# Patient Record
Sex: Female | Born: 1955 | Race: White | Hispanic: No | Marital: Married | Smoking: Never smoker
Health system: Southern US, Community
[De-identification: ages and names within clinical notes are randomized; demographics above are authoritative.]

## PROBLEM LIST (undated history)

## (undated) DIAGNOSIS — E7800 Pure hypercholesterolemia, unspecified: Secondary | ICD-10-CM

## (undated) DIAGNOSIS — I10 Essential (primary) hypertension: Secondary | ICD-10-CM

## (undated) HISTORY — PX: ABDOMINAL SURGERY: SHX537

## (undated) HISTORY — PX: TONSILLECTOMY: SUR1361

## (undated) HISTORY — PX: BUNIONECTOMY: SHX129

---

## 1998-06-04 ENCOUNTER — Other Ambulatory Visit: Admission: RE | Admit: 1998-06-04 | Discharge: 1998-06-04 | Payer: Self-pay | Admitting: Obstetrics and Gynecology

## 1999-02-05 ENCOUNTER — Other Ambulatory Visit: Admission: RE | Admit: 1999-02-05 | Discharge: 1999-02-05 | Payer: Self-pay | Admitting: Obstetrics and Gynecology

## 1999-06-10 ENCOUNTER — Encounter: Payer: Self-pay | Admitting: Family Medicine

## 1999-06-10 ENCOUNTER — Encounter: Admission: RE | Admit: 1999-06-10 | Discharge: 1999-06-10 | Payer: Self-pay | Admitting: Family Medicine

## 1999-06-30 ENCOUNTER — Other Ambulatory Visit: Admission: RE | Admit: 1999-06-30 | Discharge: 1999-06-30 | Payer: Self-pay | Admitting: Obstetrics and Gynecology

## 2010-09-10 ENCOUNTER — Encounter: Payer: Self-pay | Admitting: Thoracic Surgery

## 2012-11-05 ENCOUNTER — Emergency Department (HOSPITAL_BASED_OUTPATIENT_CLINIC_OR_DEPARTMENT_OTHER)
Admission: EM | Admit: 2012-11-05 | Discharge: 2012-11-05 | Disposition: A | Discharge status: Home / Self Care | Payer: BC Managed Care – PPO | Attending: Emergency Medicine | Admitting: Emergency Medicine

## 2012-11-05 ENCOUNTER — Encounter (HOSPITAL_BASED_OUTPATIENT_CLINIC_OR_DEPARTMENT_OTHER): Payer: Self-pay | Admitting: Emergency Medicine

## 2012-11-05 DIAGNOSIS — I10 Essential (primary) hypertension: Secondary | ICD-10-CM | POA: Insufficient documentation

## 2012-11-05 DIAGNOSIS — J3489 Other specified disorders of nose and nasal sinuses: Secondary | ICD-10-CM | POA: Insufficient documentation

## 2012-11-05 DIAGNOSIS — E7800 Pure hypercholesterolemia, unspecified: Secondary | ICD-10-CM | POA: Insufficient documentation

## 2012-11-05 DIAGNOSIS — J019 Acute sinusitis, unspecified: Secondary | ICD-10-CM | POA: Insufficient documentation

## 2012-11-05 DIAGNOSIS — Z79899 Other long term (current) drug therapy: Secondary | ICD-10-CM | POA: Insufficient documentation

## 2012-11-05 HISTORY — DX: Pure hypercholesterolemia, unspecified: E78.00

## 2012-11-05 HISTORY — DX: Essential (primary) hypertension: I10

## 2012-11-05 MED ORDER — AZITHROMYCIN 250 MG PO TABS: 250.0000 mg | ORAL_TABLET | Freq: Every day | ORAL | Status: DC

## 2012-11-05 NOTE — ED Notes (Signed)
Pt having sinus/facial pressure x one week.  Worse today.  No known fever or congestion.  Pain over right eyebrow.

## 2012-11-05 NOTE — ED Provider Notes (Signed)
History     CSN: [id number]  Arrival date & time 11/05/12  1325   First MD Initiated Contact with Patient 11/05/12 1338      Chief Complaint  Patient presents with  . Facial Pain    (Consider location/radiation/quality/duration/timing/severity/associated sxs/prior treatment) Patient is a 57 y.o. female presenting with URI. The history is provided by the patient.  URI Presenting symptoms: congestion and facial pain   Severity:  Moderate Onset quality:  Gradual Duration:  2 weeks Timing:  Constant Progression:  Worsening Chronicity:  New Relieved by:  Nothing Worsened by:  Nothing tried Ineffective treatments:  Decongestant, hot fluids and OTC medications Associated symptoms: sinus pain     Past Medical History  Diagnosis Date  . Hypertension   . Hypercholesteremia     Past Surgical History  Procedure Laterality Date  . Tonsillectomy    . Abdominal surgery    . Bunionectomy      No family history on file.  History  Substance Use Topics  . Smoking status: Not on file  . Smokeless tobacco: Not on file  . Alcohol Use: Not on file    OB History   Grav Para Term Preterm Abortions TAB SAB Ect Mult Living                  Review of Systems  HENT: Positive for congestion.   All other systems reviewed and are negative.    Allergies  Sulfa antibiotics  Home Medications   Current Outpatient Rx  Name  Route  Sig  Dispense  Refill  . atorvastatin (LIPITOR) 10 MG tablet   Oral   Take 10 mg by mouth daily.         Marland Kitchen UNKNOWN TO PATIENT      htn med           BP 131/71  Pulse 80  Temp(Src) 97.8 F (36.6 C) (Oral)  Resp 16  Ht 5\' 6"  (1.676 m)  Wt 215 lb (97.523 kg)  BMI 34.72 kg/m2  SpO2 98%  Physical Exam  Nursing note and vitals reviewed. Constitutional: She is oriented to person, place, and time. She appears well-developed and well-nourished. No distress.  HENT:  Head: Normocephalic and atraumatic.  Mouth/Throat: Oropharynx is clear  and moist.  Bilateral TM's are clear.  There is ttp over the maxillary and frontal sinuses.  Neck: Normal range of motion. Neck supple.  Cardiovascular: Normal rate and regular rhythm.  Exam reveals no gallop and no friction rub.   No murmur heard. Pulmonary/Chest: Effort normal and breath sounds normal. No respiratory distress. She has no wheezes.  Abdominal: Soft. Bowel sounds are normal. She exhibits no distension. There is no tenderness.  Musculoskeletal: Normal range of motion.  Neurological: She is alert and oriented to person, place, and time.  Skin: Skin is warm and dry. She is not diaphoretic.    ED Course  Procedures (including critical care time)  Labs Reviewed - No data to display No results found.   No diagnosis found.    MDM  Will treat with zmax for acute sinusitis.        Geoffery Lyons, MD 11/05/12 1350

## 2013-01-06 ENCOUNTER — Encounter (HOSPITAL_BASED_OUTPATIENT_CLINIC_OR_DEPARTMENT_OTHER): Payer: Self-pay | Admitting: *Deleted

## 2013-01-06 ENCOUNTER — Emergency Department (HOSPITAL_BASED_OUTPATIENT_CLINIC_OR_DEPARTMENT_OTHER)
Admission: EM | Admit: 2013-01-06 | Discharge: 2013-01-06 | Disposition: A | Discharge status: Home / Self Care | Payer: BC Managed Care – PPO | Attending: Emergency Medicine | Admitting: Emergency Medicine

## 2013-01-06 DIAGNOSIS — E7800 Pure hypercholesterolemia, unspecified: Secondary | ICD-10-CM | POA: Insufficient documentation

## 2013-01-06 DIAGNOSIS — R0981 Nasal congestion: Secondary | ICD-10-CM

## 2013-01-06 DIAGNOSIS — Z79899 Other long term (current) drug therapy: Secondary | ICD-10-CM | POA: Insufficient documentation

## 2013-01-06 DIAGNOSIS — I10 Essential (primary) hypertension: Secondary | ICD-10-CM | POA: Insufficient documentation

## 2013-01-06 DIAGNOSIS — J3489 Other specified disorders of nose and nasal sinuses: Secondary | ICD-10-CM | POA: Insufficient documentation

## 2013-01-06 MED ORDER — FEXOFENADINE HCL 60 MG PO TABS: 60.0000 mg | ORAL_TABLET | Freq: Two times a day (BID) | ORAL | Status: DC

## 2013-01-06 MED ORDER — BECLOMETHASONE DIPROP MONOHYD 42 MCG/SPRAY NA SUSP: 2.0000 | Freq: Two times a day (BID) | NASAL | Status: DC

## 2013-01-06 MED ORDER — AMOXICILLIN-POT CLAVULANATE 875-125 MG PO TABS: 1.0000 | ORAL_TABLET | Freq: Two times a day (BID) | ORAL | Status: DC

## 2013-01-06 NOTE — ED Provider Notes (Signed)
CSN: [id number]     Arrival date & time 01/06/13  1007 History     First MD Initiated Contact with Patient 01/06/13 1020     Chief Complaint  Patient presents with  . Recurrent Sinusitis   (Consider location/radiation/quality/duration/timing/severity/associated sxs/prior Treatment) The history is provided by the patient.  Lisa Olsen is a 57 y.o. female history sinusitis here with sinus congestion. Sent congestion for the last month. Initial course of Z-Pak in June. It is a pressure in her sinuses as well as one episode of greenish discharge today. Tried some nasal congestion yesterday with minimal relief. Does not know if she has any history of seasonal allergies and she is not on oral antihistamines.    Past Medical History  Diagnosis Date  . Hypertension   . Hypercholesteremia    Past Surgical History  Procedure Laterality Date  . Tonsillectomy    . Abdominal surgery    . Bunionectomy     History reviewed. No pertinent family history. History  Substance Use Topics  . Smoking status: Never Smoker   . Smokeless tobacco: Not on file  . Alcohol Use: No   OB History   Grav Para Term Preterm Abortions TAB SAB Ect Mult Living                 Review of Systems  HENT: Positive for congestion and rhinorrhea.   All other systems reviewed and are negative.    Allergies  Sulfa antibiotics  Home Medications   Current Outpatient Rx  Name  Route  Sig  Dispense  Refill  . atorvastatin (LIPITOR) 10 MG tablet   Oral   Take 10 mg by mouth daily.         Marland Kitchen azithromycin (ZITHROMAX) 250 MG tablet   Oral   Take 1 tablet (250 mg total) by mouth daily. Take first 2 tablets together, then 1 every day until finished.   6 tablet   0   . UNKNOWN TO PATIENT      htn med          BP 145/77  Pulse 91  Temp(Src) 98.4 F (36.9 C)  Resp 18  SpO2 98% Physical Exam  Nursing note and vitals reviewed. Constitutional: She is oriented to person, place, and time. She  appears well-developed and well-nourished.  HENT:  Head: Normocephalic.  Mouth/Throat: Oropharynx is clear and moist.  Mild bilateral sinus tenderness. No purulent drainage from nose.   Eyes: Conjunctivae are normal. Pupils are equal, round, and reactive to light.  Neck: Normal range of motion. Neck supple.  Cardiovascular: Normal rate, regular rhythm and normal heart sounds.   Pulmonary/Chest: Effort normal and breath sounds normal. No respiratory distress. She has no wheezes. She has no rales.  Abdominal: Soft. Bowel sounds are normal. She exhibits no distension. There is no tenderness. There is no rebound.  Musculoskeletal: Normal range of motion.  Neurological: She is alert and oriented to person, place, and time.  Skin: Skin is warm.  Psychiatric: She has a normal mood and affect. Her behavior is normal. Judgment and thought content normal.    ED Course   Procedures (including critical care time)  Labs Reviewed - No data to display No results found. No diagnosis found.  MDM  [name] is a 57 y.o. female here with sinus congestion. Likely from allergies. Will prescribe allegra and nasal steroids. Not febrile but given hx of recurrent infections, I told her to take augmentin in 2 days if  symptoms not improving.    Richardean Canal, MD 01/06/13 1058

## 2013-01-06 NOTE — ED Notes (Signed)
PT reports sinus infection in June.  Finished antibiotics.  Pt reports being 'stuffy' since June.  Reports that she has pressure behind her eye.

## 2015-07-14 ENCOUNTER — Emergency Department (HOSPITAL_BASED_OUTPATIENT_CLINIC_OR_DEPARTMENT_OTHER)
Admission: EM | Admit: 2015-07-14 | Discharge: 2015-07-14 | Disposition: A | Discharge status: Home / Self Care | Payer: 59 | Attending: Emergency Medicine | Admitting: Emergency Medicine

## 2015-07-14 ENCOUNTER — Encounter (HOSPITAL_BASED_OUTPATIENT_CLINIC_OR_DEPARTMENT_OTHER): Payer: Self-pay | Admitting: *Deleted

## 2015-07-14 DIAGNOSIS — Z79899 Other long term (current) drug therapy: Secondary | ICD-10-CM | POA: Insufficient documentation

## 2015-07-14 DIAGNOSIS — I10 Essential (primary) hypertension: Secondary | ICD-10-CM | POA: Insufficient documentation

## 2015-07-14 DIAGNOSIS — J0100 Acute maxillary sinusitis, unspecified: Secondary | ICD-10-CM | POA: Insufficient documentation

## 2015-07-14 DIAGNOSIS — R51 Headache: Secondary | ICD-10-CM | POA: Diagnosis present

## 2015-07-14 DIAGNOSIS — E7800 Pure hypercholesterolemia, unspecified: Secondary | ICD-10-CM | POA: Insufficient documentation

## 2015-07-14 DIAGNOSIS — Z7951 Long term (current) use of inhaled steroids: Secondary | ICD-10-CM | POA: Insufficient documentation

## 2015-07-14 MED ORDER — AMOXICILLIN-POT CLAVULANATE 875-125 MG PO TABS: 1.0000 | ORAL_TABLET | Freq: Two times a day (BID) | ORAL | Status: AC

## 2015-07-14 MED ORDER — OXYMETAZOLINE HCL 0.05 % NA SOLN: 1.0000 | Freq: Two times a day (BID) | NASAL | Status: AC

## 2015-07-14 MED ORDER — NAPROXEN 500 MG PO TABS: 500.0000 mg | ORAL_TABLET | Freq: Two times a day (BID) | ORAL | Status: AC

## 2015-07-14 MED ORDER — PSEUDOEPHEDRINE HCL 30 MG PO TABS: 30.0000 mg | ORAL_TABLET | ORAL | Status: AC | PRN

## 2015-07-14 NOTE — Discharge Instructions (Signed)
Please read and follow all provided instructions.  Your diagnoses today include:  1. Acute maxillary sinusitis, recurrence not specified    Tests performed today include:  Vital signs. See below for your results today.   Medications prescribed:   Pseudoephedrine - decongestant medication to help with nasal congestion   Oxymetazoline - nasal spray for congestion. Do not use for more than 3 days because this medicine can cause rebound congestion.    Naproxen - anti-inflammatory pain medication  Do not exceed  naproxen every 12 hours, take with food  You have been prescribed an anti-inflammatory medication or NSAID. Take with food. Take smallest effective dose for the shortest duration needed for your pain. Stop taking if you experience stomach pain or vomiting.    Augmentin - antibiotic  You have been prescribed an antibiotic medicine: take the entire course of medicine even if you are feeling better. Stopping early can cause the antibiotic not to work.  Take any prescribed medications only as directed.  Home care instructions:  Follow any educational materials contained in this packet.  BE VERY CAREFUL not to take multiple medicines containing Tylenol (also called acetaminophen). Doing so can lead to an overdose which can damage your liver and cause liver failure and possibly death.   Follow-up instructions: Please follow-up with your primary care provider in the next 5 days for further evaluation of your symptoms.   Return instructions:   Please return to the Emergency Department if you experience worsening symptoms.   Please return if you have any other emergent concerns.  Additional Information:  Your vital signs today were: BP 158/83 mmHg   Pulse 98   Temp(Src) 99.2 F (37.3 C) (Oral)   Resp 18   Ht  (1.702 m)   Wt 99.791 kg   BMI 34.45 kg/m2   SpO2 97% If your blood pressure (BP) was elevated above 135/85 this visit, please have this repeated by your  doctor within one month. --------------

## 2015-07-14 NOTE — ED Notes (Signed)
Right side facial pain x 1 day- pt states similar sx with sinus infection in the past- denies fever

## 2015-07-14 NOTE — ED Provider Notes (Signed)
CSN: [id number]     Arrival date & time 07/14/15  1959 History   First MD Initiated Contact with Patient 07/14/15 2147     Chief Complaint  Patient presents with  . Facial Pain     (Consider location/radiation/quality/duration/timing/severity/associated sxs/prior Treatment) HPI Comments: Patient presents with complaint of sinusitis starting one day ago. Patient complains of right maxillary tenderness and pain with associated headache. She states that pain radiates to her teeth. She has had similar symptoms with sinus infections in the past. She's taken one Sudafed without relief. No treatment prior to arrival. Onset of symptoms acute. Course constant. Nothing makes symptoms better or worse. No chest pain, cough, shortness of breath. No nausea, vomiting or abdominal pain.  The history is provided by the patient.    Past Medical History  Diagnosis Date  . Hypertension   . Hypercholesteremia    Past Surgical History  Procedure Laterality Date  . Tonsillectomy    . Abdominal surgery    . Bunionectomy     No family history on file. Social History  Substance Use Topics  . Smoking status: Never Smoker   . Smokeless tobacco: None  . Alcohol Use: No   OB History    No data available     Review of Systems  Constitutional: Negative for fever, chills and fatigue.  HENT: Positive for congestion and sinus pressure. Negative for ear pain, rhinorrhea and sore throat.   Eyes: Negative for redness.  Respiratory: Negative for cough and wheezing.   Gastrointestinal: Negative for nausea, vomiting, abdominal pain and diarrhea.  Genitourinary: Negative for dysuria.  Musculoskeletal: Negative for myalgias and neck stiffness.  Skin: Negative for rash.  Neurological: Positive for headaches.  Hematological: Negative for adenopathy.      Allergies  Sulfa antibiotics  Home Medications   Prior to Admission medications   Medication Sig Start Date End Date Taking? Authorizing Provider   amoxicillin-clavulanate (AUGMENTIN) 875-125 MG per tablet Take 1 tablet by mouth 2 (two) times daily. One po bid x 7 days 01/06/13   Richardean Canal, MD  atorvastatin (LIPITOR) 10 MG tablet Take 10 mg by mouth daily.    Historical Provider, MD  azithromycin (ZITHROMAX) 250 MG tablet Take 1 tablet (250 mg total) by mouth daily. Take first 2 tablets together, then 1 every day until finished. 11/05/12   Geoffery Lyons, MD  beclomethasone (BECONASE-AQ) 42 MCG/SPRAY nasal spray Place 2 sprays into the nose 2 (two) times daily. Dose is for each nostril. 01/06/13   Richardean Canal, MD  fexofenadine (ALLEGRA) 60 MG tablet Take 1 tablet (60 mg total) by mouth 2 (two) times daily. 01/06/13   Richardean Canal, MD  UNKNOWN TO PATIENT htn med    Historical Provider, MD   BP 158/83 mmHg  Pulse 98  Temp(Src) 99.2 F (37.3 C) (Oral)  Resp 18  Ht  (1.702 m)  Wt 99.791 kg  BMI 34.45 kg/m2  SpO2 97% Physical Exam  Constitutional: She appears well-developed and well-nourished.  HENT:  Head: Normocephalic and atraumatic.  Right Ear: Tympanic membrane, external ear and ear canal normal.  Left Ear: Tympanic membrane, external ear and ear canal normal.  Nose: Mucosal edema and rhinorrhea present. Right sinus exhibits maxillary sinus tenderness.  Mouth/Throat: Uvula is midline, oropharynx is clear and moist and mucous membranes are normal. Mucous membranes are not dry. No oral lesions. No trismus in the jaw. No uvula swelling. No oropharyngeal exudate, posterior oropharyngeal edema, posterior oropharyngeal erythema or tonsillar  abscesses.  Eyes: Conjunctivae are normal. Right eye exhibits no discharge. Left eye exhibits no discharge.  Neck: Normal range of motion. Neck supple.  Cardiovascular: Normal rate, regular rhythm and normal heart sounds.   Pulmonary/Chest: Effort normal and breath sounds normal. No respiratory distress. She has no wheezes. She has no rales.  Abdominal: Soft. There is no tenderness.  Lymphadenopathy:     She has no cervical adenopathy.  Neurological: She is alert.  Skin: Skin is warm and dry.  Psychiatric: She has a normal mood and affect.  Nursing note and vitals reviewed.   ED Course  Procedures (including critical care time) Labs Review Labs Reviewed - No data to display  Imaging Review No results found. I have personally reviewed and evaluated these images and lab results as part of my medical decision-making.   EKG Interpretation None       10:13 PM Patient seen and examined.  Vital signs reviewed and are as follows: BP 158/83 mmHg  Pulse 98  Temp(Src) 99.2 F (37.3 C) (Oral)  Resp 18  Ht  (1.702 m)  Wt 99.791 kg  BMI 34.45 kg/m2  SpO2 97%  Conservative treatment with decongestant, Afrin, NSAIDs.  Per patient request -- Augmentin given. Patient agrees to only fill this antibiotic if she has symptoms lasting longer than 7 days or develops a fever greater than 101F. We discussed that her sx are most likely viral. Patient verbalizes understanding and agrees with plan.    MDM   Final diagnoses:  Acute maxillary sinusitis, recurrence not specified   Patient with signs and symptoms consistent with acute sinusitis. Patient is afebrile, well-appearing. States BP is typically very well controlled. No h/o ACS.    Renne Crigler, PA-C 07/14/15 2224  Jerelyn Scott, MD 07/14/15 2227

## 2016-06-24 DIAGNOSIS — Z23 Encounter for immunization: Secondary | ICD-10-CM | POA: Diagnosis not present

## 2016-11-26 DIAGNOSIS — Z882 Allergy status to sulfonamides status: Secondary | ICD-10-CM | POA: Diagnosis not present

## 2016-11-26 DIAGNOSIS — N1330 Unspecified hydronephrosis: Secondary | ICD-10-CM | POA: Diagnosis not present

## 2016-11-26 DIAGNOSIS — R350 Frequency of micturition: Secondary | ICD-10-CM | POA: Diagnosis not present

## 2016-11-26 DIAGNOSIS — N134 Hydroureter: Secondary | ICD-10-CM | POA: Diagnosis not present

## 2016-11-26 DIAGNOSIS — N201 Calculus of ureter: Secondary | ICD-10-CM | POA: Diagnosis not present

## 2016-11-27 DIAGNOSIS — R300 Dysuria: Secondary | ICD-10-CM | POA: Diagnosis not present

## 2018-06-28 DIAGNOSIS — R0981 Nasal congestion: Secondary | ICD-10-CM | POA: Diagnosis not present

## 2018-06-28 DIAGNOSIS — R05 Cough: Secondary | ICD-10-CM | POA: Diagnosis not present

## 2020-04-18 ENCOUNTER — Telehealth (HOSPITAL_BASED_OUTPATIENT_CLINIC_OR_DEPARTMENT_OTHER): Payer: Self-pay | Admitting: Emergency Medicine

## 2020-04-18 ENCOUNTER — Other Ambulatory Visit: Payer: Self-pay

## 2020-04-18 ENCOUNTER — Emergency Department (HOSPITAL_BASED_OUTPATIENT_CLINIC_OR_DEPARTMENT_OTHER): Payer: Self-pay

## 2020-04-18 ENCOUNTER — Emergency Department (HOSPITAL_BASED_OUTPATIENT_CLINIC_OR_DEPARTMENT_OTHER)
Admission: EM | Admit: 2020-04-18 | Discharge: 2020-04-18 | Disposition: A | Discharge status: Home / Self Care | Payer: Self-pay | Attending: Emergency Medicine | Admitting: Emergency Medicine

## 2020-04-18 ENCOUNTER — Encounter (HOSPITAL_BASED_OUTPATIENT_CLINIC_OR_DEPARTMENT_OTHER): Payer: Self-pay | Admitting: Emergency Medicine

## 2020-04-18 DIAGNOSIS — Z20822 Contact with and (suspected) exposure to covid-19: Secondary | ICD-10-CM | POA: Insufficient documentation

## 2020-04-18 DIAGNOSIS — R5383 Other fatigue: Secondary | ICD-10-CM | POA: Insufficient documentation

## 2020-04-18 DIAGNOSIS — R202 Paresthesia of skin: Secondary | ICD-10-CM | POA: Insufficient documentation

## 2020-04-18 DIAGNOSIS — R509 Fever, unspecified: Secondary | ICD-10-CM | POA: Insufficient documentation

## 2020-04-18 DIAGNOSIS — R0981 Nasal congestion: Secondary | ICD-10-CM | POA: Insufficient documentation

## 2020-04-18 DIAGNOSIS — I10 Essential (primary) hypertension: Secondary | ICD-10-CM | POA: Insufficient documentation

## 2020-04-18 DIAGNOSIS — K120 Recurrent oral aphthae: Secondary | ICD-10-CM | POA: Insufficient documentation

## 2020-04-18 DIAGNOSIS — R0602 Shortness of breath: Secondary | ICD-10-CM | POA: Insufficient documentation

## 2020-04-18 DIAGNOSIS — Z79899 Other long term (current) drug therapy: Secondary | ICD-10-CM | POA: Insufficient documentation

## 2020-04-18 LAB — CBC WITH DIFFERENTIAL/PLATELET
Abs Immature Granulocytes: 0.05 10*3/uL (ref 0.00–0.07)
Basophils Absolute: 0.1 10*3/uL (ref 0.0–0.1)
Basophils Relative: 1 %
Eosinophils Absolute: 0.4 10*3/uL (ref 0.0–0.5)
Eosinophils Relative: 4 %
HCT: 41 % (ref 36.0–46.0)
Hemoglobin: 13.8 g/dL (ref 12.0–15.0)
Immature Granulocytes: 1 %
Lymphocytes Relative: 17 %
Lymphs Abs: 1.6 10*3/uL (ref 0.7–4.0)
MCH: 27.6 pg (ref 26.0–34.0)
MCHC: 33.7 g/dL (ref 30.0–36.0)
MCV: 82 fL (ref 80.0–100.0)
Monocytes Absolute: 0.5 10*3/uL (ref 0.1–1.0)
Monocytes Relative: 6 %
Neutro Abs: 6.5 10*3/uL (ref 1.7–7.7)
Neutrophils Relative %: 71 %
Platelets: 217 10*3/uL (ref 150–400)
RBC: 5 MIL/uL (ref 3.87–5.11)
RDW: 15 % (ref 11.5–15.5)
WBC: 9.1 10*3/uL (ref 4.0–10.5)
nRBC: 0 % (ref 0.0–0.2)

## 2020-04-18 LAB — RESP PANEL BY RT-PCR (FLU A&B, COVID) ARPGX2
Influenza A by PCR: NEGATIVE
Influenza B by PCR: NEGATIVE
SARS Coronavirus 2 by RT PCR: NEGATIVE

## 2020-04-18 LAB — COMPREHENSIVE METABOLIC PANEL
ALT: 71 U/L — ABNORMAL HIGH (ref 0–44)
AST: 71 U/L — ABNORMAL HIGH (ref 15–41)
Albumin: 4.1 g/dL (ref 3.5–5.0)
Alkaline Phosphatase: 203 U/L — ABNORMAL HIGH (ref 38–126)
Anion gap: 13 (ref 5–15)
BUN: 13 mg/dL (ref 8–23)
CO2: 22 mmol/L (ref 22–32)
Calcium: 9.2 mg/dL (ref 8.9–10.3)
Chloride: 103 mmol/L (ref 98–111)
Creatinine, Ser: 0.65 mg/dL (ref 0.44–1.00)
GFR, Estimated: >60 mL/min (ref >60)
Glucose, Bld: 99 mg/dL (ref 70–99)
Potassium: 3.6 mmol/L (ref 3.5–5.1)
Sodium: 138 mmol/L (ref 135–145)
Total Bilirubin: 0.8 mg/dL (ref 0.3–1.2)
Total Protein: 7.5 g/dL (ref 6.5–8.1)

## 2020-04-18 LAB — TSH: TSH: 0.734 u[IU]/mL (ref 0.350–4.500)

## 2020-04-18 LAB — D-DIMER, QUANTITATIVE: D-Dimer, Quant: 0.79 ug/mL-FEU — ABNORMAL HIGH (ref 0.00–0.50)

## 2020-04-18 LAB — MAGNESIUM: Magnesium: 1.9 mg/dL (ref 1.7–2.4)

## 2020-04-18 MED ORDER — SODIUM CHLORIDE 0.9 % IV SOLN
Freq: Once | INTRAVENOUS | Status: AC
Start: 2020-04-18 — End: 2020-04-18

## 2020-04-18 MED ORDER — MAGIC MOUTHWASH W/LIDOCAINE: 5.0000 mL | Freq: Three times a day (TID) | ORAL | 0 refills | Status: AC | PRN

## 2020-04-18 MED ORDER — IOHEXOL 350 MG/ML SOLN
100.0000 mL | Freq: Once | INTRAVENOUS | Status: AC | PRN
  Administered 2020-04-18: 100 mL via INTRAVENOUS

## 2020-04-18 MED ORDER — ACETAMINOPHEN 325 MG PO TABS
650.0000 mg | ORAL_TABLET | Freq: Once | ORAL | Status: AC
  Administered 2020-04-18: 650 mg via ORAL
  Filled 2020-04-18: qty 2

## 2020-04-18 NOTE — Discharge Instructions (Addendum)
Please follow-up with your primary care doctor. Please establish care with one. I also provided you with information for a neurologist. Please follow-up with them for your left leg and left arm paresthesias.  As we discussed if you develop any weakness, facial drooping, slurred speech, confusion, or any other new or concerning symptoms please come to the ER for reevaluation.  I provided you with a mouthwash to use this to rinse and spit up to 3 times daily as needed for your mouth pain.  Please use Flonase also twice daily

## 2020-04-18 NOTE — ED Notes (Signed)
Pt standing outside the lab requesting a wheelchair. Wheelchair brought to pt and pt taken outside to vehicle.

## 2020-04-18 NOTE — ED Triage Notes (Signed)
Pt c/o ongoing nasal congestion x 2 weeks. She was treated with abx without relief. Also reports sores in her mouth. She states that last night she noticed the L side of her body was numb. She states it feels like it is asleep.

## 2020-04-18 NOTE — ED Notes (Signed)
Ambulated to br with assist and then to CT
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
## 2020-04-18 NOTE — ED Notes (Signed)
Radiology attempted to get pt for imaging. RN asked to come back after IV was in place

## 2020-04-18 NOTE — ED Notes (Signed)
States that she had seen a dr for her cold and was on antibiotics for 7 days prior to coming and cannot breath thru her nose

## 2020-04-18 NOTE — ED Notes (Signed)
Noticed her left arm and hand were numb at 10 pm she got up  And then  Midnight her left leg was not working right  ,  Woke up ate breakfast and fixed her own  Breakfast  Eggs and grits  Nothing got better and she decided to come in ,  Denies incontinence,  Speech is clear  , no facial droop VAN is neg
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
## 2020-04-18 NOTE — ED Provider Notes (Signed)
[facility] EMERGENCY DEPARTMENT Provider Note   CSN: [id number] Arrival date & time: 04/18/20  1344     History Chief Complaint  Patient presents with  . Nasal Congestion  . Numbness    Lisa Olsen is a 64 y.o. female.  HPI  Patient is a 64 year old female with past medical history significant for HTN and hypercholesterolemia presented today with complaints of left-sided body paresthesias.  She states that 18 days ago she started having congestion and fevers and chills.  She was placed on Augmentin which with this for 7 days she states that this did not help significantly and she continues to have sinus congestion and pressure.  She states that he days ago she started having canker sores in her mouth and last night at around 10 PM she noticed that she had a gradual onset of left-sided paresthesias she describes as the entire left side of her body not including her face.   She states that she is also now having trouble walking she states that she has trouble knowing where her feet and hands are in space.  She endorsed some confusion last night when operating the microwave.  She denies any lightheadedness, dizziness, headache, chest pain. She does endorse some SOB but Seems to have some difficulty differentiating this from sinus congestion or difficulty breathing.  She is obese but denies any history of VTE, hemoptysis, heart palpitations.  Denies any unilateral leg swelling or edema.  Denies any weight changes.    Past Medical History:  Diagnosis Date  . Hypercholesteremia   . Hypertension     There are no problems to display for this patient.   Past Surgical History:  Procedure Laterality Date  . ABDOMINAL SURGERY    . BUNIONECTOMY    . TONSILLECTOMY       OB History   No obstetric history on file.     No family history on file.  Social History   Tobacco Use  . Smoking status: Never Smoker  . Smokeless tobacco: Never Used  Substance Use Topics   . Alcohol use: No  . Drug use: No    Home Medications Prior to Admission medications   Medication Sig Start Date End Date Taking? Authorizing Provider  amoxicillin-clavulanate (AUGMENTIN) 875-125 MG tablet Take 1 tablet by mouth every 12 (twelve) hours. 07/14/15   Carlisle Cater, PA-C  magic mouthwash w/lidocaine SOLN Take 5 mLs by mouth 3 (three) times daily as needed for mouth pain. 1 part diphenhydramine, 1 part maalox, 1 part 2% viscous lidocaine 04/18/20   Makeda Peeks, [place] S, PA  naproxen (NAPROSYN) 500 MG tablet Take 1 tablet (500 mg total) by mouth 2 (two) times daily. 07/14/15   Carlisle Cater, PA-C  oxymetazoline (AFRIN NASAL SPRAY) 0.05 % nasal spray Place 1 spray into both nostrils 2 (two) times daily. 07/14/15   Carlisle Cater, PA-C  pseudoephedrine (SUDAFED) 30 MG tablet Take 1 tablet (30 mg total) by mouth every 4 (four) hours as needed for congestion. 07/14/15   Carlisle Cater, PA-C  UNKNOWN TO PATIENT htn med    [provider]  atorvastatin (LIPITOR) 10 MG tablet Take 10 mg by mouth daily.  07/14/15  [provider]  beclomethasone (BECONASE-AQ) 42 MCG/SPRAY nasal spray Place 2 sprays into the nose 2 (two) times daily. Dose is for each nostril. 01/06/13 07/14/15  Drenda Freeze, MD  fexofenadine (ALLEGRA) 60 MG tablet Take 1 tablet (60 mg total) by mouth 2 (two) times daily. 01/06/13 07/14/15  Drenda Freeze, MD    Allergies    Sulfa antibiotics  Review of Systems   Review of Systems  Constitutional: Positive for chills, fatigue and fever.  HENT: Positive for congestion, sinus pressure and sinus pain.        Canker sores  Eyes: Negative for pain.  Respiratory: Positive for shortness of breath. Negative for cough.   Cardiovascular: Negative for chest pain and leg swelling.  Gastrointestinal: Negative for abdominal distention, abdominal pain, diarrhea, nausea and vomiting.  Genitourinary: Negative for dysuria.  Musculoskeletal: Negative for myalgias.   Skin: Negative for rash.  Neurological: Positive for numbness. Negative for dizziness and headaches.    Physical Exam Updated Vital Signs BP (!) 161/91   Pulse (!) 101   Temp 99.5 F (37.5 C) (Oral)   Resp 20   Ht 5' 7"  (1.702 m)   Wt 99 kg   SpO2 97%   BMI 34.18 kg/m   Physical Exam Vitals and nursing note reviewed.  Constitutional:      General: She is not in acute distress.    Appearance: She is obese.     Comments: Somewhat fatigued appearing 64 year old female who is obese.  In no significant distress.  HENT:     Head: Normocephalic and atraumatic.     Nose: Nose normal.     Mouth/Throat:     Mouth: Mucous membranes are dry.     Comments: Several small scattered 2 mm in diameter canker sores located in the posterior mouth primarily over the soft palate and at the base of the tongue Eyes:     General: No scleral icterus.    Extraocular Movements: Extraocular movements intact.     Pupils: Pupils are equal, round, and reactive to light.  Cardiovascular:     Rate and Rhythm: Regular rhythm. Tachycardia present.     Pulses: Normal pulses.     Heart sounds: Normal heart sounds.     Comments: Tachycardic heart rate of 120 Pulmonary:     Effort: Pulmonary effort is normal. No respiratory distress.     Breath sounds: Normal breath sounds. No wheezing.     Comments: Lungs are clear to auscultation all fields Respiratory rate is 22 unlabored speaking full sentences. Abdominal:     Palpations: Abdomen is soft.     Tenderness: There is no abdominal tenderness. There is no guarding or rebound.  Musculoskeletal:     Cervical back: Normal range of motion.     Right lower leg: No edema.     Left lower leg: No edema.  Skin:    General: Skin is warm and dry.     Capillary Refill: Capillary refill takes less than 2 seconds.  Neurological:     Mental Status: She is alert. Mental status is at baseline.     Comments: Alert and oriented to self, place, time and event.   Speech  is fluent, clear without dysarthria or dysphasia.   Strength 5/5 in upper/lower extremities  Sensation intact in upper/lower extremities  Seems to show good proprioception in bilateral upper and lower extremities with normal cerebellar function with heel-to-shin finger-nose and toe tapping  Gait seems hesitant but she is ambulatory around the entire room.  Negative Romberg. No pronator drift.  Normal finger-to-nose and feet tapping.  CN I not tested  CN II grossly intact visual fields bilaterally. Did not visualize posterior eye.   CN III, IV, VI PERRLA and EOMs intact bilaterally  CN V Intact sensation to sharp and  light touch to the face  CN VII facial movements symmetric  CN VIII not tested  CN IX, X no uvula deviation, symmetric rise of soft palate  CN XI 5/5 SCM and trapezius strength bilaterally  CN XII Midline tongue protrusion, symmetric L/R movements   Psychiatric:        Mood and Affect: Mood normal.        Behavior: Behavior normal.     ED Results / Procedures / Treatments   Labs (all labs ordered are listed, but only abnormal results are displayed) Labs Reviewed  COMPREHENSIVE METABOLIC PANEL - Abnormal; Notable for the following components:      Result Value   AST 71 (*)    ALT 71 (*)    Alkaline Phosphatase 203 (*)    All other components within normal limits  D-DIMER, QUANTITATIVE (NOT AT [facility]) - Abnormal; Notable for the following components:   D-Dimer, Quant 0.79 (*)    All other components within normal limits  RESP PANEL BY RT-PCR (FLU A&B, COVID) ARPGX2  CBC WITH DIFFERENTIAL/PLATELET  MAGNESIUM  TSH    EKG EKG Interpretation  Date/Time:  Thursday April 18 2020 14:30:46 EST Ventricular Rate:  105 PR Interval:    QRS Duration: 95 QT Interval:  343 QTC Calculation: 454 R Axis:   57 Text Interpretation: Sinus tachycardia Consider right atrial enlargement no ischemic changes Confirmed by Theotis Burrow [phone]) on 04/18/2020 4:52:34  PM   Radiology CT Head Wo Contrast  Result Date: 04/18/2020 CLINICAL DATA:  LEFT-sided numbness EXAM: CT HEAD WITHOUT CONTRAST TECHNIQUE: Contiguous axial images were obtained from the base of the skull through the vertex without intravenous contrast. COMPARISON:  None. FINDINGS: Brain: Hypodensity of the RIGHT periventricular white matter along the superior aspect of the caudate, favored chronic given fluid attenuation (series 2, image 15). No acute hemorrhage. Vascular: Vascular calcifications. Skull: Normal. Negative for fracture or focal lesion. Sinuses/Orbits: Mucosal thickening and frothy fluid throughout the frontal sinuses and ethmoid air cells. Air-fluid levels within the sphenoid sinuses. Other: None. IMPRESSION: 1. Hypodensity of the RIGHT periventricular white matter along the superior aspect of the caudate, favored chronic given fluid attenuation. MRI could be utilized to further evaluate for acute infarction if clinically suspected. 2. Paranasal sinus disease as above. Findings could reflect acute sinusitis in the appropriate clinical setting. Electronically Signed   By: Valentino Saxon MD   On: 04/18/2020 15:21   CT Angio Chest PE W/Cm &/Or Wo Cm  Result Date: 04/18/2020 CLINICAL DATA:  Low to intermediate probability of pulmonary embolism. Positive D-dimer. Nasal congestion. EXAM: CT ANGIOGRAPHY CHEST WITH CONTRAST TECHNIQUE: Multidetector CT imaging of the chest was performed using the standard protocol during bolus administration of intravenous contrast. Multiplanar CT image reconstructions and MIPs were obtained to evaluate the vascular anatomy. CONTRAST:  17m OMNIPAQUE IOHEXOL 350 MG/ML SOLN COMPARISON:  Chest radiography same day. Chest CT report 06/23/2013. FINDINGS: Cardiovascular: Heart size upper limits of normal. No pericardial effusion. Coronary artery calcification and aortic atherosclerotic calcification. Pulmonary arterial opacification is good. No pulmonary emboli.  Mediastinum/Nodes: No mass or lymphadenopathy. Lungs/Pleura: No pleural effusion. The lungs are clear. No infiltrate or collapse. Upper Abdomen: No significant upper abdominal finding. Early perfusion pattern of the spleen. Musculoskeletal: Ordinary mild thoracic degenerative changes. Review of the MIP images confirms the above findings. IMPRESSION: 1. No pulmonary emboli or other acute chest pathology. 2. Coronary artery calcification and aortic atherosclerotic calcification. Aortic Atherosclerosis (ICD10-I70.0). Electronically Signed   By: MJan FiremanD.  On: 04/18/2020 17:03   DG Chest Port 1 View  Result Date: 04/18/2020 CLINICAL DATA:  Nasal congestion EXAM: PORTABLE CHEST 1 VIEW COMPARISON:  None. FINDINGS: The cardiomediastinal silhouette is normal in contour. Atherosclerotic calcifications of the aorta. Mild elevation of the RIGHT hemidiaphragm. No pleural effusion. No pneumothorax. No acute pleuroparenchymal abnormality. Visualized abdomen is unremarkable. No acute osseous abnormality noted. IMPRESSION: No acute cardiopulmonary abnormality. Electronically Signed   By: Valentino Saxon MD   On: 04/18/2020 15:22    Procedures Procedures (including critical care time)  Medications Ordered in ED Medications  0.9 %  sodium chloride infusion (0 mLs Intravenous Stopped 04/18/20 1641)  acetaminophen (TYLENOL) tablet 650 mg (650 mg Oral Given 04/18/20 1522)  iohexol (OMNIPAQUE) 350 MG/ML injection 100 mL (100 mLs Intravenous Contrast Given 04/18/20 1632)    ED Course  I have reviewed the triage vital signs and the nursing notes.  Pertinent labs & imaging results that were available during my care of the patient were reviewed by me and considered in my medical decision making (see chart for details).  Patient is 64 year old female presenting today with some congestion shortness of breath, fatigue, left-sided body numbness, canker sores.  Patient symptoms are very consistent with viral  illness however she is significantly tachycardic when she first arrived in the ER with a low-grade temperature of 99.9 and a heart rate of 123 and was mildly tachypneic.  Her SPO2 is been in the low end of normal 94/95 on room air.  On my examination her lungs are clear in all fields.  She denies any chest pain and seems to be attributing her shortness of breath to her sinus congestion however given the full clinical picture, obesity, low end of normal SPO2 and tachycardia and tachypnea will obtain dimer to rule out PE.  Her neurologic exam is reassuring she has no objective weakness she is ambulatory although she seems to express that is more difficult than usual she is walking without difficulty on my examination.  Her lower extremities are also symmetrically strong as well as her upper extremities.  She has good sensation in bilateral upper and lower extremities and with further questioning it appears that these are more paresthesias that she is experiencing in her left arm and left leg.  Chest x-ray without any acute abnormality EKG is sinus tachycardia. CT head shows small likely chronic hyperdensity of the right periventricular white matter along the superior aspect of the caudate.  Will discuss with neurology. CT PE study obtained given elevated dimer which is negative for pulmonary embolism.  Patient's lab work is overall relatively unremarkable. Patient states that she always had mild transaminitis which she is present today with AST and ALT symmetrically mildly elevated and alk phos 203.  No other significant abnormalities on CMP. Magnesium within normal limits, TSH within normal limits, CBC without leukocytosis or anemia and Covid test is negative.  Clinical Course as of Apr 20 1515  Thu Apr 18, 2020  1617 Discussed with Sal of neurology. Given the lack of objective weakness and the patient is outside the TPA window and the CT without contrast of the head favors chronic finding recommended  outpatient neurology follow-up.   [WF]    Clinical Course User Index [WF] Tedd Sias, [place]   MDM Rules/Calculators/A&P                          Discussed the case with attending physician who is agreeable with  my plan to discharge at this time with follow-up with primary care doctor and close return precautions.  Patient feels relatively unchanged after Tylenol and fluids although her tachycardia has significantly improved she is now under 100 HR.  It seems that her primary concern now after reevaluating her multiple times is the aphthous ulcers in her mouth.  Will prescribe her Magic mouthwash with lidocaine.  She will follow up outpatient with neurology per neurologist's recommendations.  She understands return precautions.  All questions answered the best my ability.  Patient tolerating p.o. and ambulatory at time of discharge.  Final Clinical Impression(s) / ED Diagnoses Final diagnoses:  Sinus congestion  Fatigue, unspecified type  Aphthous ulcer  Paresthesias    Rx / DC Orders ED Discharge Orders         Ordered    magic mouthwash w/lidocaine SOLN  3 times daily PRN        04/18/20 1732           Tedd Sias, [place] 04/19/20 1517    Little, Wenda Overland, MD 04/19/20 [phone]

## 2021-08-27 ENCOUNTER — Emergency Department (HOSPITAL_BASED_OUTPATIENT_CLINIC_OR_DEPARTMENT_OTHER)
Admission: EM | Admit: 2021-08-27 | Discharge: 2021-08-27 | Disposition: A | Discharge status: Home / Self Care | Payer: PPO | Attending: Emergency Medicine | Admitting: Emergency Medicine

## 2021-08-27 ENCOUNTER — Other Ambulatory Visit: Payer: Self-pay

## 2021-08-27 ENCOUNTER — Encounter (HOSPITAL_BASED_OUTPATIENT_CLINIC_OR_DEPARTMENT_OTHER): Payer: Self-pay | Admitting: Emergency Medicine

## 2021-08-27 DIAGNOSIS — H00014 Hordeolum externum left upper eyelid: Secondary | ICD-10-CM | POA: Insufficient documentation

## 2021-08-27 DIAGNOSIS — H5712 Ocular pain, left eye: Secondary | ICD-10-CM | POA: Diagnosis present

## 2021-08-27 MED ORDER — CEPHALEXIN 500 MG PO CAPS: 500.0000 mg | ORAL_CAPSULE | Freq: Three times a day (TID) | ORAL | 0 refills | Status: AC

## 2021-08-27 NOTE — Discharge Instructions (Addendum)
Read the information about styes attached to these papers.  You may do warm compresses to try and drain the stye.  Throw away your eye make-up that you used on Saturday. ? ?Additionally, get in touch with an eye doctor to check your vision since you have not been evaluated in some time. ? ?Your prescription is printed and attached to these papers for you to take to your pharmacy. ?

## 2021-08-27 NOTE — ED Triage Notes (Signed)
Left eye sty , no drainage , painful .  ?

## 2021-08-27 NOTE — ED Provider Notes (Signed)
?[facility] EMERGENCY DEPARTMENT ?Provider Note ? ? ?CSN: [id number] ?Arrival date & time: 08/27/21  1209 ? ?  ? ?History ? ?Chief Complaint  ?Patient presents with  ? Eye Problem  ?  left  ? ? ?Lisa Olsen is a 66 y.o. female presenting with pain and inflammation to her left upper eyelid.  Reports that this has been going on for 7 to 10 days.  Says the pain is getting more severe.  Denies any fevers or chills.  Denies blurry vision.  Says that she does not wear contact lenses however does wear make-up.  Says that she wore make-up on Saturday which seemed to make her symptoms worse.  No photophobia. ? ? ? ?Home Medications ?Prior to Admission medications   ?Medication Sig Start Date End Date Taking? Authorizing Provider  ?cephALEXin (KEFLEX) 500 MG capsule Take 1 capsule (500 mg total) by mouth 3 (three) times daily for 5 days. 08/27/21 09/01/21 Yes Jadyn Brasher A, PA-C  ?amoxicillin-clavulanate (AUGMENTIN) 875-125 MG tablet Take 1 tablet by mouth every 12 (twelve) hours. 07/14/15   Renne Crigler, PA-C  ?magic mouthwash w/lidocaine SOLN Take 5 mLs by mouth 3 (three) times daily as needed for mouth pain. 1 part diphenhydramine, 1 part maalox, 1 part 2% viscous lidocaine 04/18/20   [place], [place] S, PA  ?naproxen (NAPROSYN) 500 MG tablet Take 1 tablet (500 mg total) by mouth 2 (two) times daily. 07/14/15   Renne Crigler, PA-C  ?oxymetazoline (AFRIN NASAL SPRAY) 0.05 % nasal spray Place 1 spray into both nostrils 2 (two) times daily. 07/14/15   Renne Crigler, PA-C  ?pseudoephedrine (SUDAFED) 30 MG tablet Take 1 tablet (30 mg total) by mouth every 4 (four) hours as needed for congestion. 07/14/15   Renne Crigler, PA-C  ?UNKNOWN TO PATIENT htn med    [provider]  ?atorvastatin (LIPITOR) 10 MG tablet Take 10 mg by mouth daily.  07/14/15  [provider]  ?beclomethasone (BECONASE-AQ) 42 MCG/SPRAY nasal spray Place 2 sprays into the nose 2 (two) times daily. Dose is for each nostril.  01/06/13 07/14/15  Charlynne Pander, MD  ?fexofenadine (ALLEGRA) 60 MG tablet Take 1 tablet (60 mg total) by mouth 2 (two) times daily. 01/06/13 07/14/15  Charlynne Pander, MD  ?   ? ?Allergies    ?Sulfa antibiotics   ? ?Review of Systems   ?Review of Systems  ?Constitutional:  Negative for chills and fever.  ?Eyes:  Positive for pain and redness. Negative for photophobia, itching and visual disturbance.  ? ?Physical Exam ?Updated Vital Signs ?BP (!) 159/84 (BP Location: Left Arm)   Pulse 82   Temp 98.4 ?F (36.9 ?C) (Oral)   Resp 18   Ht 5\' 7"  (1.702 m)   Wt 100.7 kg   SpO2 95%   BMI 34.77 kg/m?  ?Physical Exam ?Vitals and nursing note reviewed.  ?Constitutional:   ?   Appearance: Normal appearance.  ?HENT:  ?   Head: Normocephalic and atraumatic.  ?Eyes:  ?   General: No scleral icterus. ?   Extraocular Movements: Extraocular movements intact.  ?   Conjunctiva/sclera: Conjunctivae normal.  ?   Pupils: Pupils are equal, round, and reactive to light.  ?   Comments: Stye of the left upper lid.  Erythematous, some associated surrounding cellulitis.  Normal right eye  ?Pulmonary:  ?   Effort: Pulmonary effort is normal. No respiratory distress.  ?Skin: ?   Findings: No rash.  ?Neurological:  ?  Mental Status: She is alert.  ?Psychiatric:     ?   Mood and Affect: Mood normal.  ? ? ?ED Results / Procedures / Treatments   ?Labs ?(all labs ordered are listed, but only abnormal results are displayed) ?Labs Reviewed - No data to display ? ?EKG ?None ? ?Radiology ?No results found. ? ?Procedures ?Procedures  ? ?Medications Ordered in ED ?Medications - No data to display ? ?ED Course/ Medical Decision Making/ A&P ?  ?                        ?Medical Decision Making ?Risk ?Prescription drug management. ? ? ?66 year old female presenting due to a stye.  Reports its been there for 7 to 10 days however is not getting any better.  She used eye make-up on Saturday which seemed to worsen her symptoms ? ?Physical exam revealing  of hordeolum to the left upper eyelid.  Surrounding cellulitis.  EOMs intact, no photophobia.  I believe patient is stable for discharge home with antibiotics for potential early periorbital cellulitis. She will also use warm compresses and throw out her make-up and wash her pillowcases. ? ? ?Final Clinical Impression(s) / ED Diagnoses ?Final diagnoses:  ?Hordeolum externum of left upper eyelid  ? ? ?Rx / DC Orders ?ED Discharge Orders   ? ?      Ordered  ?  cephALEXin (KEFLEX) 500 MG capsule  3 times daily       ? 08/27/21 1357  ? ?  ?  ? ?  ? ?Results and diagnoses were explained to the patient. Return precautions discussed in full. Patient had no additional questions and expressed complete understanding. ? ? ?This chart was dictated using voice recognition software.  Despite best efforts to proofread,  errors can occur which can change the documentation meaning.  ?  ?Saddie Benders, PA-C ?08/27/21 1810 ? ?  ?Terrilee Files, MD ?08/28/21 [phone] ? ?

## 2022-02-06 IMAGING — CT CT ANGIO CHEST
2 of 10 series · 18 of 36 positions shown · IV contrast (omnipaque)
Comparison: Chest radiography same day. Chest CT report 06/23/2013.

CLINICAL DATA: Low to intermediate probability of pulmonary
embolism. Positive D-dimer. Nasal congestion.

EXAM:
CT ANGIOGRAPHY CHEST WITH CONTRAST
TECHNIQUE: Multidetector CT imaging of the chest was performed using the
standard protocol during bolus administration of intravenous
contrast. Multiplanar CT image reconstructions and MIPs were
obtained to evaluate the vascular anatomy.
CONTRAST:  100mL OMNIPAQUE IOHEXOL 350 MG/ML SOLN

[Series 8: pe thins · axial · 0.65mm/px · z∈[-242,-23]mm · 17 of 247 slices shown]
[im 14/247  lung]
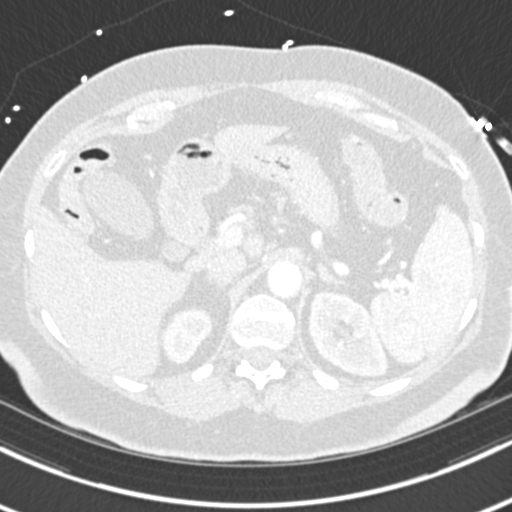
[im 28/247  mediastinal]
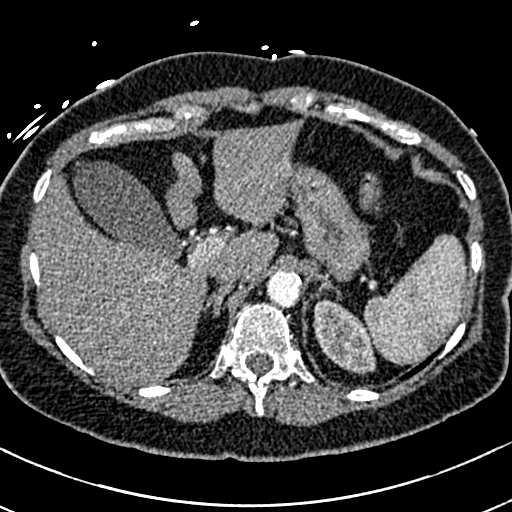
[im 42/247  lung]
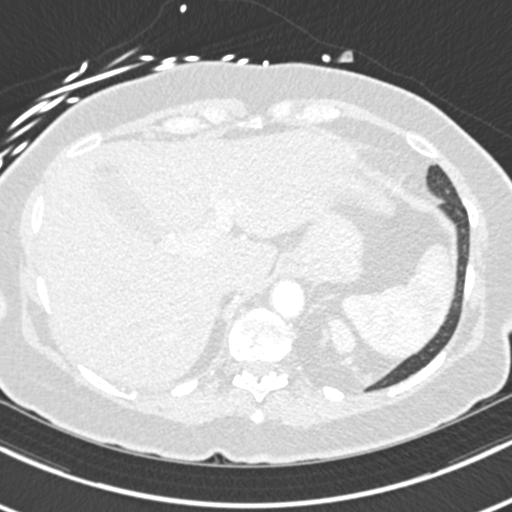
[im 55/247  mediastinal]
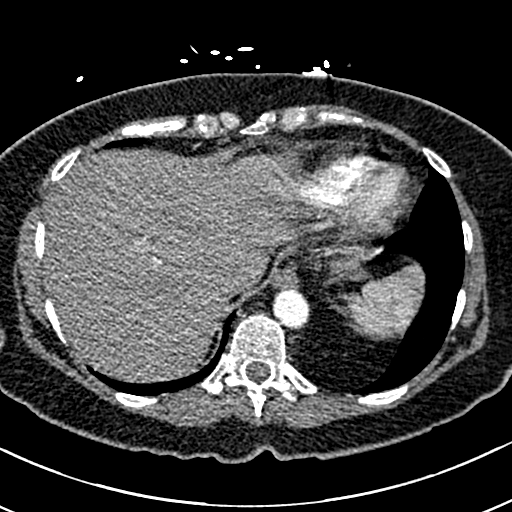
[im 69/247  lung]
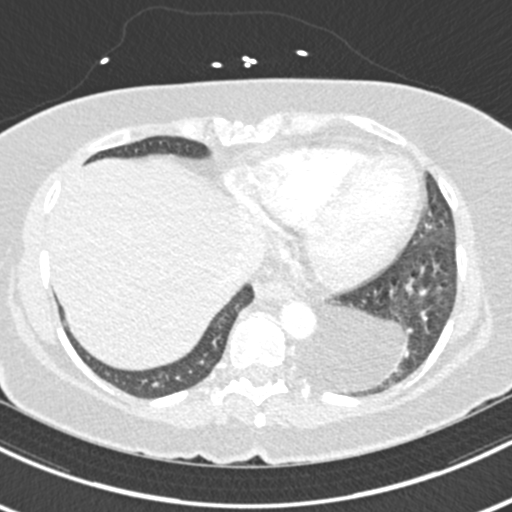
[im 83/247  mediastinal]
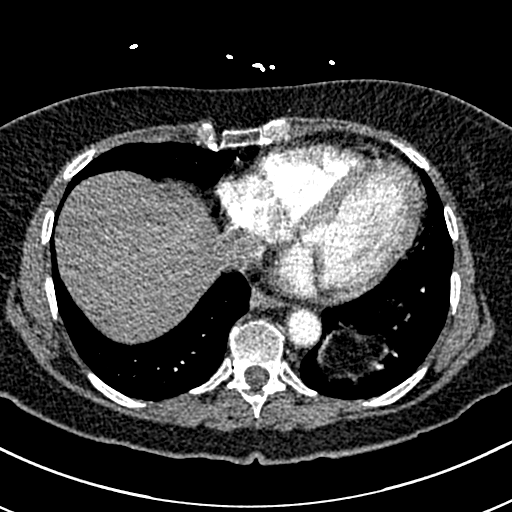
[im 96/247  lung]
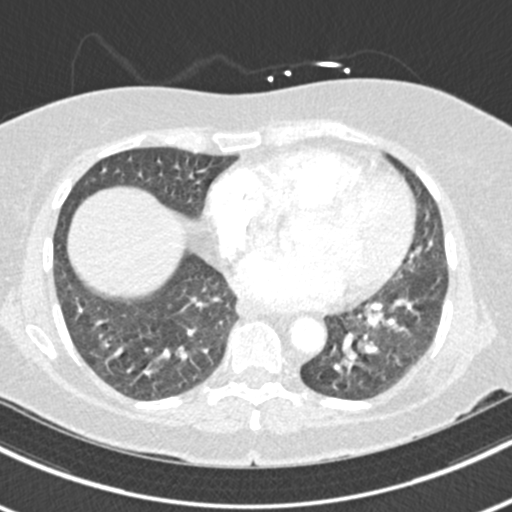
[im 110/247  mediastinal]
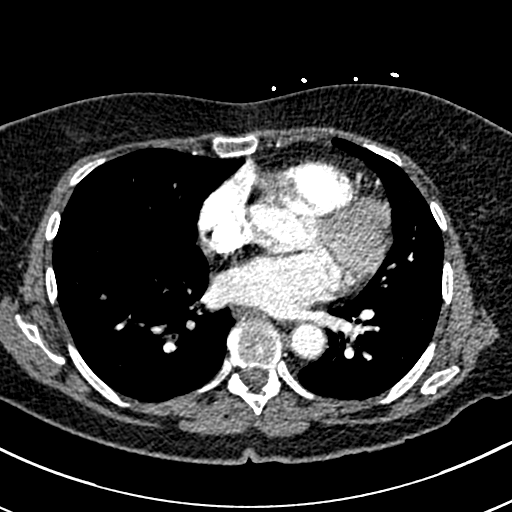
[im 124/247  lung]
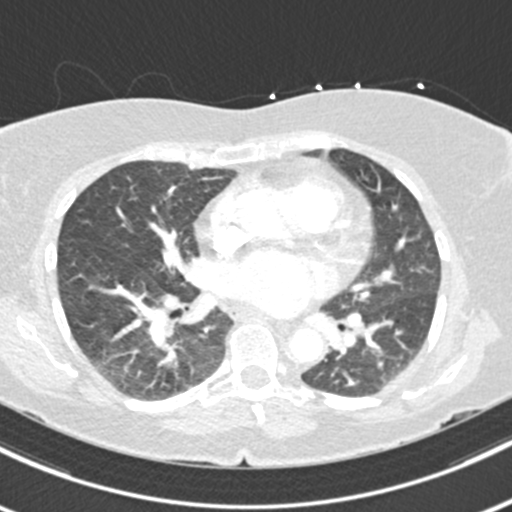
[im 137/247  mediastinal]
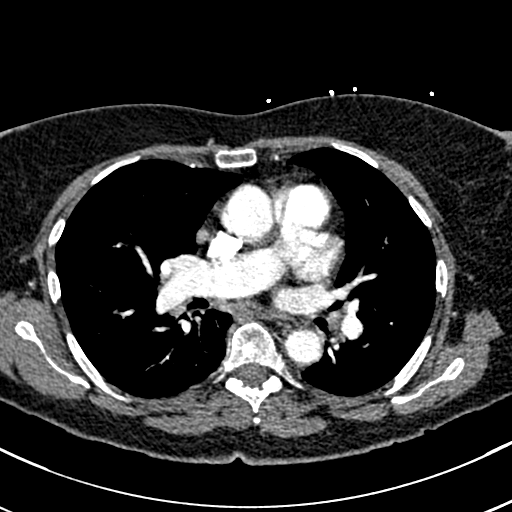
[im 151/247  lung]
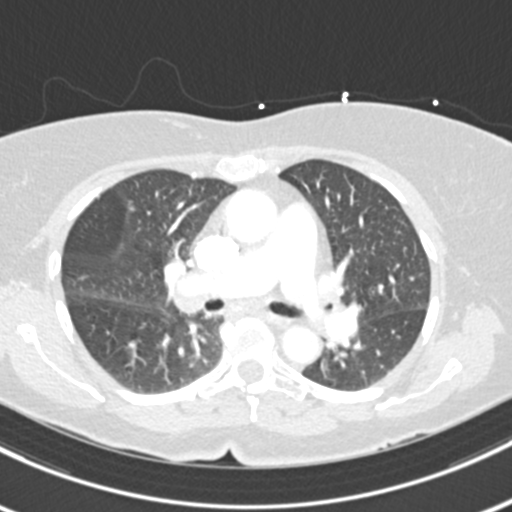
[im 165/247  mediastinal]
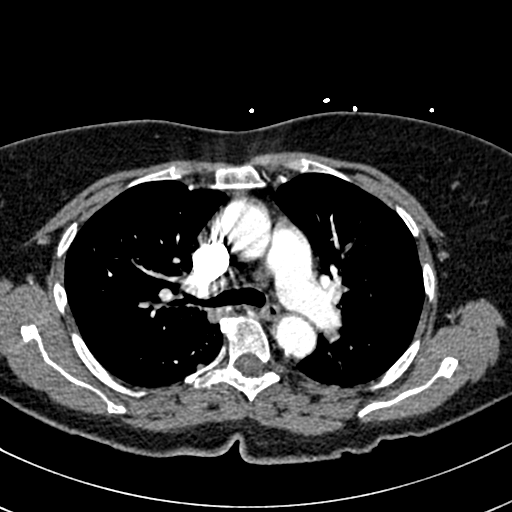
[im 178/247  lung]
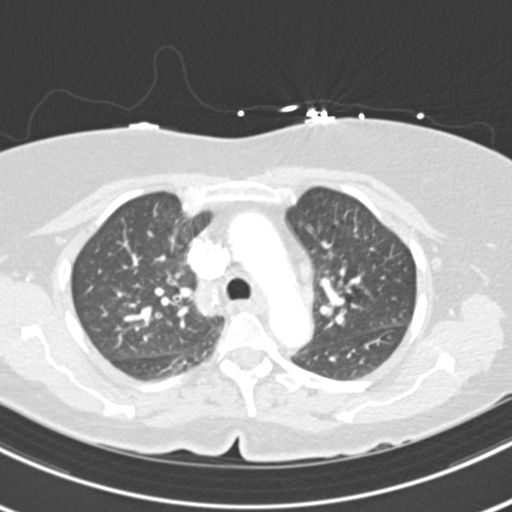
[im 192/247  mediastinal]
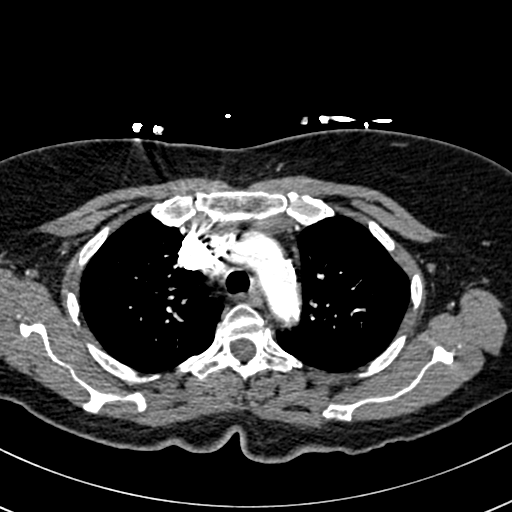
[im 206/247  lung]
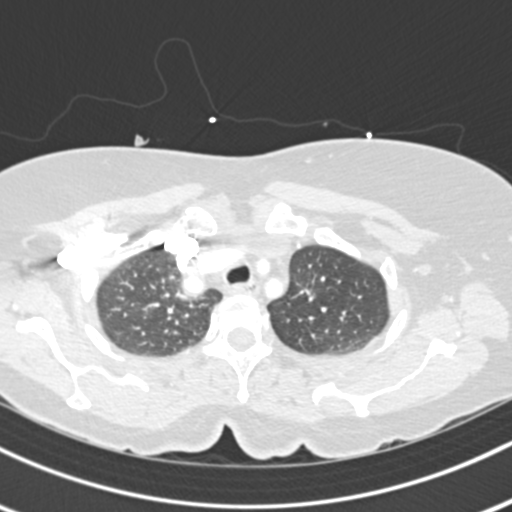
[im 219/247  mediastinal]
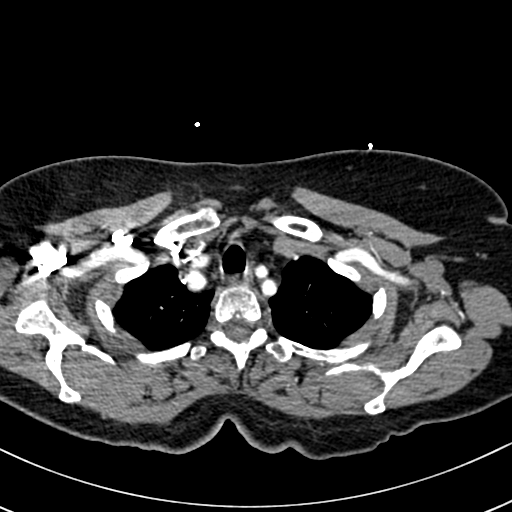
[im 233/247  lung]
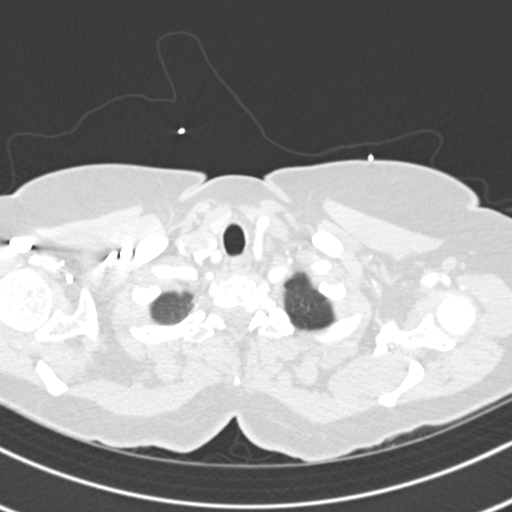

[Series 9: pe coronal mpr · coronal · 0.53mm/px · 1 of 110 slices shown]
[im 55/110  mediastinal]
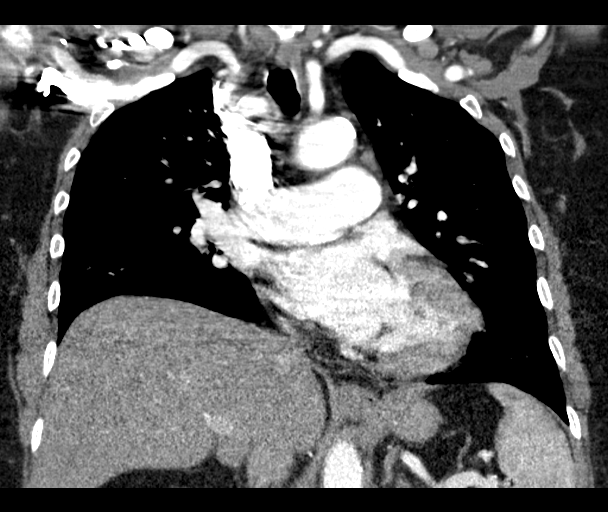

[18 of 36 positions shown; findings below may reference images not displayed]

FINDINGS: Cardiovascular: Heart size upper limits of normal. No pericardial
effusion. Coronary artery calcification and aortic atherosclerotic
calcification. Pulmonary arterial opacification is good. No
pulmonary emboli.

Mediastinum/Nodes: No mass or lymphadenopathy.

Lungs/Pleura: No pleural effusion. The lungs are clear. No
infiltrate or collapse.

Upper Abdomen: No significant upper abdominal finding. Early
perfusion pattern of the spleen.

Musculoskeletal: Ordinary mild thoracic degenerative changes.

Review of the MIP images confirms the above findings.
IMPRESSION: 1. No pulmonary emboli or other acute chest pathology.
2. Coronary artery calcification and aortic atherosclerotic
calcification.

Aortic Atherosclerosis ([id number]).
